# Patient Record
Sex: Male | Born: 1980 | Race: White | Hispanic: No | Marital: Single | Smoking: Current every day smoker
Health system: Southern US, Community
[De-identification: ages and names within clinical notes are randomized; demographics above are authoritative.]

## PROBLEM LIST (undated history)

## (undated) DIAGNOSIS — J45909 Unspecified asthma, uncomplicated: Secondary | ICD-10-CM

## (undated) DIAGNOSIS — M419 Scoliosis, unspecified: Secondary | ICD-10-CM

## (undated) HISTORY — PX: BACK SURGERY: SHX140

## (undated) HISTORY — PX: EYE SURGERY: SHX253

---

## 2020-03-24 ENCOUNTER — Emergency Department
Admission: EM | Admit: 2020-03-24 | Discharge: 2020-03-24 | Disposition: A | Discharge status: Home / Self Care | Payer: Self-pay | Attending: Emergency Medicine | Admitting: Emergency Medicine

## 2020-03-24 ENCOUNTER — Emergency Department: Payer: Self-pay

## 2020-03-24 ENCOUNTER — Other Ambulatory Visit: Payer: Self-pay

## 2020-03-24 ENCOUNTER — Encounter: Payer: Self-pay | Admitting: Emergency Medicine

## 2020-03-24 DIAGNOSIS — R519 Headache, unspecified: Secondary | ICD-10-CM | POA: Insufficient documentation

## 2020-03-24 DIAGNOSIS — R112 Nausea with vomiting, unspecified: Secondary | ICD-10-CM | POA: Insufficient documentation

## 2020-03-24 DIAGNOSIS — Z20822 Contact with and (suspected) exposure to covid-19: Secondary | ICD-10-CM | POA: Insufficient documentation

## 2020-03-24 DIAGNOSIS — J45909 Unspecified asthma, uncomplicated: Secondary | ICD-10-CM | POA: Insufficient documentation

## 2020-03-24 DIAGNOSIS — R0602 Shortness of breath: Secondary | ICD-10-CM

## 2020-03-24 DIAGNOSIS — M549 Dorsalgia, unspecified: Secondary | ICD-10-CM | POA: Insufficient documentation

## 2020-03-24 DIAGNOSIS — F17200 Nicotine dependence, unspecified, uncomplicated: Secondary | ICD-10-CM | POA: Insufficient documentation

## 2020-03-24 HISTORY — DX: Scoliosis, unspecified: M41.9

## 2020-03-24 HISTORY — DX: Unspecified asthma, uncomplicated: J45.909

## 2020-03-24 LAB — CBC WITH DIFFERENTIAL/PLATELET
Abs Immature Granulocytes: 0.02 10*3/uL (ref 0.00–0.07)
Basophils Absolute: 0.1 10*3/uL (ref 0.0–0.1)
Basophils Relative: 1 %
Eosinophils Absolute: 0.2 10*3/uL (ref 0.0–0.5)
Eosinophils Relative: 2 %
HCT: 44.2 % (ref 39.0–52.0)
Hemoglobin: 15.2 g/dL (ref 13.0–17.0)
Immature Granulocytes: 0 %
Lymphocytes Relative: 33 %
Lymphs Abs: 2.4 10*3/uL (ref 0.7–4.0)
MCH: 32.1 pg (ref 26.0–34.0)
MCHC: 34.4 g/dL (ref 30.0–36.0)
MCV: 93.4 fL (ref 80.0–100.0)
Monocytes Absolute: 0.5 10*3/uL (ref 0.1–1.0)
Monocytes Relative: 7 %
Neutro Abs: 4.1 10*3/uL (ref 1.7–7.7)
Neutrophils Relative %: 57 %
Platelets: 89 10*3/uL — ABNORMAL LOW (ref 150–400)
RBC: 4.73 MIL/uL (ref 4.22–5.81)
RDW: 12.4 % (ref 11.5–15.5)
WBC: 7.3 10*3/uL (ref 4.0–10.5)
nRBC: 0 % (ref 0.0–0.2)

## 2020-03-24 LAB — COMPREHENSIVE METABOLIC PANEL
ALT: 14 U/L (ref 0–44)
AST: 18 U/L (ref 15–41)
Albumin: 4.4 g/dL (ref 3.5–5.0)
Alkaline Phosphatase: 52 U/L (ref 38–126)
Anion gap: 8 (ref 5–15)
BUN: 13 mg/dL (ref 6–20)
CO2: 26 mmol/L (ref 22–32)
Calcium: 9 mg/dL (ref 8.9–10.3)
Chloride: 106 mmol/L (ref 98–111)
Creatinine, Ser: 0.93 mg/dL (ref 0.61–1.24)
GFR calc Af Amer: >60 mL/min (ref >60)
GFR calc non Af Amer: >60 mL/min (ref >60)
Glucose, Bld: 89 mg/dL (ref 70–99)
Potassium: 4.1 mmol/L (ref 3.5–5.1)
Sodium: 140 mmol/L (ref 135–145)
Total Bilirubin: 0.6 mg/dL (ref 0.3–1.2)
Total Protein: 7.2 g/dL (ref 6.5–8.1)

## 2020-03-24 LAB — RESPIRATORY PANEL BY RT PCR (FLU A&B, COVID)
Influenza A by PCR: NEGATIVE
Influenza B by PCR: NEGATIVE
SARS Coronavirus 2 by RT PCR: NEGATIVE

## 2020-03-24 MED ORDER — ONDANSETRON 4 MG PO TBDP: 4.0000 mg | ORAL_TABLET | Freq: Three times a day (TID) | ORAL | 0 refills | Status: DC | PRN

## 2020-03-24 MED ORDER — SODIUM CHLORIDE 0.9 % IV BOLUS
1000.0000 mL | Freq: Once | INTRAVENOUS | Status: AC
  Administered 2020-03-24: 1000 mL via INTRAVENOUS

## 2020-03-24 MED ORDER — DEXAMETHASONE SODIUM PHOSPHATE 10 MG/ML IJ SOLN
10.0000 mg | Freq: Once | INTRAMUSCULAR | Status: AC
  Administered 2020-03-24: 10 mg via INTRAVENOUS
  Filled 2020-03-24: qty 1

## 2020-03-24 MED ORDER — DIPHENHYDRAMINE HCL 50 MG/ML IJ SOLN
50.0000 mg | Freq: Once | INTRAMUSCULAR | Status: AC
  Administered 2020-03-24: 50 mg via INTRAVENOUS
  Filled 2020-03-24: qty 1

## 2020-03-24 MED ORDER — KETOROLAC TROMETHAMINE 30 MG/ML IJ SOLN
15.0000 mg | Freq: Once | INTRAMUSCULAR | Status: AC
  Administered 2020-03-24: 15 mg via INTRAVENOUS
  Filled 2020-03-24: qty 1

## 2020-03-24 MED ORDER — METOCLOPRAMIDE HCL 5 MG/ML IJ SOLN
10.0000 mg | Freq: Once | INTRAMUSCULAR | Status: AC
  Administered 2020-03-24: 10 mg via INTRAVENOUS
  Filled 2020-03-24: qty 2

## 2020-03-24 NOTE — ED Notes (Signed)
Pt to the er for headache x 5 days. No hx of migraines. Pt not taking meds at home because "pills make me sick." Pt states he was given OTC med before and they made him sick. Asked pt if he was able to take anything by mouth or took any daily meds. Pt denies.

## 2020-03-24 NOTE — ED Provider Notes (Signed)
[facility] Emergency Department Provider Note  ___________________________________________   First MD Initiated Contact with Patient 03/24/20 1103     (approximate)  I have reviewed the triage vital signs and the nursing notes.   HISTORY  Chief Complaint Migraine, Emesis, and Back Pain  HPI Sean Webster is a 39 y.o. male who presents to the emergency department with complaints of headache x5 days, vomiting x2.  Patient has had 4 total episodes of vomiting in the last 2 days.  No hematemesis or coffee ground vomit.  The patient also has chronic thoracic back pain secondary to surgery for scoliosis 22 years ago, however this pain has acutely been worse in the last 5 days.  Lastly, he complains of chest pain only when taking a deep breath that has been present for about 3 months with no acute worsening.  He has not been around any contacts that he is aware of but has not been vaccinated.  He denies weakness, fever, nasal congestion, sore throat, abdominal pain, constipation or diarrhea.    Past Medical History:  Diagnosis Date  . Asthma   . Scoliosis     There are no problems to display for this patient.   Past Surgical History:  Procedure Laterality Date  . BACK SURGERY    . EYE SURGERY      Prior to Admission medications   Medication Sig Start Date End Date Taking? Authorizing Provider  ondansetron (ZOFRAN ODT) 4 MG disintegrating tablet Take 1 tablet (4 mg total) by mouth every 8 (eight) hours as needed for nausea or vomiting. 03/24/20   Lucy Chris, PA    Allergies Patient has no known allergies.  No family history on file.  Social History Social History   Tobacco Use  . Smoking status: Current Every Day Smoker  . Smokeless tobacco: Never Used  Substance Use Topics  . Alcohol use: Not Currently  . Drug use: Not Currently    Types: Cocaine, Marijuana    Comment: last used in July    Review of Systems Constitutional: No  fever/chills Eyes: No visual changes. ENT: No sore throat. Cardiovascular: Chest pain with deep breath only Respiratory: Denies shortness of breath. Gastrointestinal: No abdominal pain.  No nausea, no vomiting.  No diarrhea.  No constipation. Genitourinary: Negative for dysuria. Musculoskeletal: +back pain. Skin: Negative for rash. Neurological: + Headache, no focal weakness or numbness.  ____________________________________________   PHYSICAL EXAM:  VITAL SIGNS: ED Triage Vitals  Enc Vitals Group     BP 03/24/20 1534 111/67     Pulse Rate 03/24/20 1001 (!) 58     Resp 03/24/20 1001 16     Temp 03/24/20 1001 97.8 F (36.6 C)     Temp Source 03/24/20 1001 Oral     SpO2 03/24/20 1001 97 %     Weight 03/24/20 0958 140 lb (63.5 kg)     Height 03/24/20 0958 5\' 8"  (1.727 m)     Head Circumference --      Peak Flow --      Pain Score 03/24/20 0958 6     Pain Loc --      Pain Edu? --      Excl. in GC? --    Constitutional: Alert and oriented. Well appearing and in no acute distress. Eyes: Conjunctivae are normal. PERRL. EOMI. Head: Atraumatic. Nose: No congestion/rhinnorhea. Mouth/Throat: Mucous membranes are moist.  Oropharynx non-erythematous. Neck: No stridor.   Cardiovascular: Normal rate, regular rhythm. Grossly normal heart  sounds.  Good peripheral circulation. Respiratory: Normal respiratory effort.  No retractions. Lungs CTAB. Gastrointestinal: Soft and nontender. No distention.  No CVA tenderness. Musculoskeletal: No lower extremity tenderness nor edema.  No joint effusions. Neurologic:  Normal speech and language. No gross focal neurologic deficits are appreciated. No gait instability. Skin:  Skin is warm, dry and intact. No rash noted. Psychiatric: Mood and affect are normal. Speech and behavior are normal.  ____________________________________________   LABS (all labs ordered are listed, but only abnormal results are displayed)  Labs Reviewed  CBC WITH  DIFFERENTIAL/PLATELET - Abnormal; Notable for the following components:      Result Value   Platelets 89 (*)    All other components within normal limits  RESPIRATORY PANEL BY RT PCR (FLU A&B, COVID)  COMPREHENSIVE METABOLIC PANEL   ____________________________________________  RADIOLOGY  Official radiology report(s): DG Chest 2 View  Result Date: 03/24/2020 CLINICAL DATA:  History of tobacco use and back pain, initial encounter EXAM: CHEST - 2 VIEW COMPARISON:  None. FINDINGS: Cardiac shadow is within normal limits. The lungs are clear bilaterally. Bilateral nipple shadows noted. No acute bony abnormality is seen. Changes of prior fusion at the thoracolumbar junction are noted. IMPRESSION: No acute abnormality noted. Electronically Signed   By: Alcide Clever M.D.   On: 03/24/2020 12:25   DG Thoracic Spine 2 View  Result Date: 03/24/2020 CLINICAL DATA:  Shortness of breath and history of tobacco use with back pain, initial encounter EXAM: THORACIC SPINE 2 VIEWS COMPARISON:  None. FINDINGS: Vertebral body height is well maintained. Changes of prior fusion from T11 to L3 is seen with lateral fixation on the left. No definitive hardware failure is seen. No compression deformities are noted. Increased kyphosis is noted likely of a chronic nature at T10-T11. No other focal abnormality is noted. IMPRESSION: Postsurgical changes as described above. Electronically Signed   By: Alcide Clever M.D.   On: 03/24/2020 12:25   CT Head Wo Contrast  Result Date: 03/24/2020 CLINICAL DATA:  Headache. EXAM: CT HEAD WITHOUT CONTRAST TECHNIQUE: Contiguous axial images were obtained from the base of the skull through the vertex without intravenous contrast. COMPARISON:  None. FINDINGS: Brain: No evidence of acute infarction, hemorrhage, hydrocephalus, extra-axial collection or mass lesion/mass effect. Patchy white matter hypoattenuation, which is advanced for patient age. Vascular: No hyperdense vessel identified.  Skull: Normal. Negative for fracture or focal lesion. Sinuses/Orbits: Mild mucosal thickening the left maxillary sinus with frothy secretions. Other: No mastoid effusions IMPRESSION: 1. No evidence of acute intracranial abnormality. 2. Patchy white matter hypoattenuation, which is age-advanced. While suboptimally evaluated and nonspecific by CT, this most likely represents chronic microvascular ischemic disease. An MRI with contrast could further evaluate if clinically indicated. Electronically Signed   By: Feliberto Harts MD   On: 03/24/2020 14:52    ____________________________________________   INITIAL IMPRESSION / ASSESSMENT AND PLAN / ED COURSE  As part of my medical decision making, I reviewed the following data within the electronic MEDICAL RECORD NUMBER Nursing notes reviewed and incorporated        Kinley Ferrentino is a 39 year old male who presents to the emergency department with headache, vomiting, acute on chronic thoracic back pain as well as chest pain with deep inspiration.  Physical exam is grossly unremarkable.  Laboratory evaluation includes respiratory panel which is negative, normal CMP and a grossly normal CBC except for thrombocytopenia.  X-ray of the chest and thoracic spine are negative for any acute findings but do show the chronic  postsurgical changes related to his previous surgery.  At that time, treatment for headache and vomiting was begun with Toradol, Reglan, Benadryl, Decadron and a bolus of fluids.  The patient states that he was only mildly improved initially with these medications.  Thus a CT was performed to rule out more sinister pathology.  This was negative for any acute changes but did show some chronic age advanced ischemic changes.  After this, the patient was reinterviewed and stated at this time his headache was making more improvement.  With a normal neuro exam and no acute findings in work-up, feel that he is stable for discharge.  I did recommend that he  follow-up with the clinic regarding the thrombocytopenia as well as chronic changes found on his CT.  [facility].  Patient is amenable with this plan.      ____________________________________________   FINAL CLINICAL IMPRESSION(S) / ED DIAGNOSES  Final diagnoses:  Acute nonintractable headache, unspecified headache type  Nausea and vomiting, intractability of vomiting not specified, unspecified vomiting type     ED Discharge Orders         Ordered    ondansetron (ZOFRAN ODT) 4 MG disintegrating tablet  Every 8 hours PRN,   Status:  Discontinued        03/24/20 1502    ondansetron (ZOFRAN ODT) 4 MG disintegrating tablet  Every 8 hours PRN        03/24/20 1517          *Please note:  Justus Droke was evaluated in Emergency Department on 03/24/2020 for the symptoms described in the history of present illness. He was evaluated in the context of the global COVID-19 pandemic, which necessitated consideration that the patient might be at risk for infection with the SARS-CoV-2 virus that causes COVID-19. Institutional protocols and algorithms that pertain to the evaluation of patients at risk for COVID-19 are in a state of rapid change based on information released by regulatory bodies including the CDC and federal and state organizations. These policies and algorithms were followed during the patient's care in the ED.  Some ED evaluations and interventions may be delayed as a result of limited staffing during and the pandemic.*   Note:  This document was prepared using Dragon voice recognition software and may include unintentional dictation errors.    Lucy Chris, PA 03/24/20 1758    Merwyn Katos, MD 03/25/20 [phone]

## 2020-03-24 NOTE — Discharge Instructions (Signed)
Please [facility] with [facility] regarding the low platelets in your blood work as well as changes seen on your CT scan. You may use ibuprofen or Tylenol for treatment of your headache. We will also prescribe Zofran for your nausea and vomiting. Please see the [facility], urgent care or return to the emergency department for any worsening symptoms.

## 2020-03-24 NOTE — ED Notes (Signed)
Pt to ct 

## 2020-03-24 NOTE — ED Triage Notes (Signed)
Pt to ED via POV c/o migraine x 5 days, pt states that it will ease off but then get stronger again. Pt states that he started vomiting last night around 2130. Pt states that he has had 4 episodes of vomiting since last night. Pt also states that he has chronic back pain and it has been worse over the past week, pt has not had any injury recently. Pt states that he did have back surgery in 1999 due to scoliosis. Pt is in NAD at this time.

## 2020-12-25 ENCOUNTER — Emergency Department: Payer: Self-pay

## 2020-12-25 ENCOUNTER — Other Ambulatory Visit: Payer: Self-pay

## 2020-12-25 ENCOUNTER — Emergency Department
Admission: EM | Admit: 2020-12-25 | Discharge: 2020-12-25 | Disposition: A | Discharge status: Home / Self Care | Payer: Self-pay | Attending: Emergency Medicine | Admitting: Emergency Medicine

## 2020-12-25 ENCOUNTER — Encounter: Payer: Self-pay | Admitting: Medical Oncology

## 2020-12-25 DIAGNOSIS — J181 Lobar pneumonia, unspecified organism: Secondary | ICD-10-CM | POA: Insufficient documentation

## 2020-12-25 DIAGNOSIS — J189 Pneumonia, unspecified organism: Secondary | ICD-10-CM

## 2020-12-25 DIAGNOSIS — Z20822 Contact with and (suspected) exposure to covid-19: Secondary | ICD-10-CM | POA: Insufficient documentation

## 2020-12-25 DIAGNOSIS — F17200 Nicotine dependence, unspecified, uncomplicated: Secondary | ICD-10-CM | POA: Insufficient documentation

## 2020-12-25 DIAGNOSIS — J45909 Unspecified asthma, uncomplicated: Secondary | ICD-10-CM | POA: Insufficient documentation

## 2020-12-25 DIAGNOSIS — Z28310 Unvaccinated for covid-19: Secondary | ICD-10-CM | POA: Insufficient documentation

## 2020-12-25 LAB — CBC WITH DIFFERENTIAL/PLATELET
Abs Immature Granulocytes: 0.06 10*3/uL (ref 0.00–0.07)
Basophils Absolute: 0.1 10*3/uL (ref 0.0–0.1)
Basophils Relative: 1 %
Eosinophils Absolute: 0.3 10*3/uL (ref 0.0–0.5)
Eosinophils Relative: 2 %
HCT: 41.8 % (ref 39.0–52.0)
Hemoglobin: 14.5 g/dL (ref 13.0–17.0)
Immature Granulocytes: 1 %
Lymphocytes Relative: 16 %
Lymphs Abs: 1.9 10*3/uL (ref 0.7–4.0)
MCH: 31.3 pg (ref 26.0–34.0)
MCHC: 34.7 g/dL (ref 30.0–36.0)
MCV: 90.3 fL (ref 80.0–100.0)
Monocytes Absolute: 0.7 10*3/uL (ref 0.1–1.0)
Monocytes Relative: 6 %
Neutro Abs: 8.8 10*3/uL — ABNORMAL HIGH (ref 1.7–7.7)
Neutrophils Relative %: 74 %
Platelets: 88 10*3/uL — ABNORMAL LOW (ref 150–400)
RBC: 4.63 MIL/uL (ref 4.22–5.81)
RDW: 12.5 % (ref 11.5–15.5)
Smear Review: NORMAL
WBC: 11.9 10*3/uL — ABNORMAL HIGH (ref 4.0–10.5)
nRBC: 0 % (ref 0.0–0.2)

## 2020-12-25 LAB — BASIC METABOLIC PANEL
Anion gap: 9 (ref 5–15)
BUN: 12 mg/dL (ref 6–20)
CO2: 28 mmol/L (ref 22–32)
Calcium: 8.5 mg/dL — ABNORMAL LOW (ref 8.9–10.3)
Chloride: 104 mmol/L (ref 98–111)
Creatinine, Ser: 0.99 mg/dL (ref 0.61–1.24)
GFR, Estimated: >60 mL/min (ref >60)
Glucose, Bld: 91 mg/dL (ref 70–99)
Potassium: 3.3 mmol/L — ABNORMAL LOW (ref 3.5–5.1)
Sodium: 141 mmol/L (ref 135–145)

## 2020-12-25 LAB — SARS CORONAVIRUS 2 (TAT 6-24 HRS): SARS Coronavirus 2: NEGATIVE

## 2020-12-25 MED ORDER — PSEUDOEPH-BROMPHEN-DM 30-2-10 MG/5ML PO SYRP: 5.0000 mL | ORAL_SOLUTION | Freq: Four times a day (QID) | ORAL | 0 refills | Status: AC | PRN

## 2020-12-25 MED ORDER — KETOROLAC TROMETHAMINE 60 MG/2ML IM SOLN
60.0000 mg | Freq: Once | INTRAMUSCULAR | Status: AC
  Administered 2020-12-25: 08:00:00 60 mg via INTRAMUSCULAR
  Filled 2020-12-25: qty 2

## 2020-12-25 MED ORDER — IBUPROFEN 800 MG PO TABS: 800.0000 mg | ORAL_TABLET | Freq: Three times a day (TID) | ORAL | 0 refills | Status: AC | PRN

## 2020-12-25 MED ORDER — AZITHROMYCIN 250 MG PO TABS: ORAL_TABLET | ORAL | 0 refills | Status: AC

## 2020-12-25 NOTE — Discharge Instructions (Addendum)
Read and follow discharge care instructions.  Take medication as directed.  Your COVID-19 test results are pending.  They will be available later MyChart app.  Advised self quarantine pending results of COVID-19 test.  Test is positive was quarantine additional 10 days.

## 2020-12-25 NOTE — ED Provider Notes (Signed)
[facility] Emergency Department Provider Note   ____________________________________________   Event Date/Time   First MD Initiated Contact with Patient 12/25/20 [phone]     (approximate)  I have reviewed the triage vital signs and the nursing notes.   HISTORY  Chief Complaint Generalized Body Aches and Cough    HPI Sean Webster is a 40 y.o. male patient presents with 2 weeks of generalized body aches, cough, and chest congestion.  Denies recent travel but believes positive contact with COVID-19.  No COVID-vaccine.         Past Medical History:  Diagnosis Date   Asthma    Scoliosis     There are no problems to display for this patient.   Past Surgical History:  Procedure Laterality Date   BACK SURGERY     EYE SURGERY      Prior to Admission medications   Medication Sig Start Date End Date Taking? Authorizing Provider  azithromycin (ZITHROMAX Z-PAK) 250 MG tablet Take 2 tablets (500 mg) on  Day 1,  followed by 1 tablet (250 mg) once daily on Days 2 through 5. 12/25/20 12/30/20 Yes Joni Reining, PA-C  brompheniramine-pseudoephedrine-DM 30-2-10 MG/5ML syrup Take 5 mLs by mouth 4 (four) times daily as needed. 12/25/20  Yes Joni Reining, PA-C  ibuprofen (ADVIL) 800 MG tablet Take 1 tablet (800 mg total) by mouth every 8 (eight) hours as needed for moderate pain. 12/25/20  Yes Joni Reining, PA-C    Allergies Patient has no known allergies.  No family history on file.  Social History Social History   Tobacco Use   Smoking status: Every Day    Pack years: 0.00   Smokeless tobacco: Never  Substance Use Topics   Alcohol use: Not Currently   Drug use: Not Currently    Types: Cocaine, Marijuana    Comment: last used in July    Review of Systems  Constitutional: Subjective fever.   eyes: No visual changes. ENT: No sore throat. Cardiovascular: Denies chest pain. Respiratory: Denies shortness of breath. Gastrointestinal: No  abdominal pain.  No nausea, no vomiting.  No diarrhea.  No constipation. Genitourinary: Negative for dysuria. Musculoskeletal: Negative for back pain. Skin: Negative for rash. Neurological: Positive for headaches, but denies focal weakness or numbness. ____________________________________________   PHYSICAL EXAM:  VITAL SIGNS: ED Triage Vitals  Enc Vitals Group     BP 12/25/20 0751 113/79     Pulse Rate 12/25/20 0751 70     Resp 12/25/20 0751 16     Temp 12/25/20 0751 98.3 F (36.8 C)     Temp Source 12/25/20 0751 Oral     SpO2 12/25/20 0751 99 %     Weight 12/25/20 0749 138 lb 14.2 oz (63 kg)     Height 12/25/20 0749 5\' 8"  (1.727 m)     Head Circumference --      Peak Flow --      Pain Score 12/25/20 0749 8     Pain Loc --      Pain Edu? --      Excl. in GC? --     Constitutional: Alert and oriented. Well appearing and in no acute distress.  Appears malaise. Eyes: Conjunctivae are normal. PERRL. EOMI. Head: Atraumatic. Nose: No congestion/rhinnorhea. Mouth/Throat: Mucous membranes are moist.  Oropharynx non-erythematous. Neck: No stridor.  No cervical spine tenderness to palpation. Hematological/Lymphatic/Immunilogical: No cervical lymphadenopathy. Cardiovascular: Normal rate, regular rhythm. Grossly normal heart sounds.  Good peripheral circulation. Respiratory:  Normal respiratory effort.  No retractions. Lungs CTAB. Gastrointestinal: Soft and nontender. No distention. No abdominal bruits. No CVA tenderness. Genitourinary: Deferred Musculoskeletal: No lower extremity tenderness nor edema.  No joint effusions. Neurologic:  Normal speech and language. No gross focal neurologic deficits are appreciated. No gait instability. Skin:  Skin is warm, dry and intact. No rash noted. Psychiatric: Mood and affect are normal. Speech and behavior are normal.  ____________________________________________   LABS (all labs ordered are listed, but only abnormal results are  displayed)  Labs Reviewed  BASIC METABOLIC PANEL - Abnormal; Notable for the following components:      Result Value   Potassium 3.3 (*)    Calcium 8.5 (*)    All other components within normal limits  CBC WITH DIFFERENTIAL/PLATELET - Abnormal; Notable for the following components:   WBC 11.9 (*)    Platelets 88 (*)    Neutro Abs 8.8 (*)    All other components within normal limits  SARS CORONAVIRUS 2 (TAT 6-24 HRS)   ____________________________________________  EKG   ____________________________________________  RADIOLOGY Margarite Gouge, personally viewed and evaluated these images (plain radiographs) as part of my medical decision making, as well as reviewing the written report by the radiologist.  ED MD interpretation:    Official radiology report(s): DG Chest Portable 1 View  Result Date: 12/25/2020 CLINICAL DATA:  Cough and congestion EXAM: PORTABLE CHEST 1 VIEW COMPARISON:  September 2021 FINDINGS: Mildly increased interstitial prominence. No pleural effusion or pneumothorax. Normal heart size. Partially imaged thoracolumbar fusion. IMPRESSION: Mildly increased interstitial prominence, which could reflect atypical/viral pneumonia. Electronically Signed   By: Guadlupe Spanish M.D.   On: 12/25/2020 08:35    ____________________________________________   PROCEDURES  Procedure(s) performed (including Critical Care):  Procedures   ____________________________________________   INITIAL IMPRESSION / ASSESSMENT AND PLAN / ED COURSE  As part of my medical decision making, I reviewed the following data within the electronic MEDICAL RECORD NUMBER         Patient presents 2 weeks of body aches, cough, intermittent fever.  Discussed chest x-ray finding with patient.  Patient advised COVID-19 test was pending.  Advised to follow discharge care instruction and self quarantine pending results of test.  If test is positive must quarantine additional 10 days.  Take medication as  directed.      ____________________________________________   FINAL CLINICAL IMPRESSION(S) / ED DIAGNOSES  Final diagnoses:  Community acquired pneumonia of left lower lobe of lung     ED Discharge Orders          Ordered    azithromycin (ZITHROMAX Z-PAK) 250 MG tablet        12/25/20 1144    ibuprofen (ADVIL) 800 MG tablet  Every 8 hours PRN        12/25/20 1144    brompheniramine-pseudoephedrine-DM 30-2-10 MG/5ML syrup  4 times daily PRN        12/25/20 1144             Note:  This document was prepared using Dragon voice recognition software and may include unintentional dictation errors.    Joni Reining, PA-C 12/25/20 1527    Merwyn Katos, MD 12/25/20 [phone]

## 2020-12-25 NOTE — ED Notes (Signed)
See triage note  Presents with body aches ,h/a and weakness for 2 weeks  Subjective fever at home but afebrile on arrival  Denies taking any OTC meds for sx's

## 2020-12-25 NOTE — ED Triage Notes (Signed)
Flu like sx's for approx 2 weeks. Pt reports body aches are worsening.

## 2021-11-25 ENCOUNTER — Emergency Department: Payer: Self-pay

## 2021-11-25 ENCOUNTER — Other Ambulatory Visit: Payer: Self-pay

## 2021-11-25 ENCOUNTER — Emergency Department
Admission: EM | Admit: 2021-11-25 | Discharge: 2021-11-25 | Disposition: A | Discharge status: Home / Self Care | Payer: Self-pay | Attending: Emergency Medicine | Admitting: Emergency Medicine

## 2021-11-25 ENCOUNTER — Encounter: Payer: Self-pay | Admitting: Emergency Medicine

## 2021-11-25 DIAGNOSIS — G43009 Migraine without aura, not intractable, without status migrainosus: Secondary | ICD-10-CM

## 2021-11-25 DIAGNOSIS — J45909 Unspecified asthma, uncomplicated: Secondary | ICD-10-CM | POA: Insufficient documentation

## 2021-11-25 DIAGNOSIS — R0602 Shortness of breath: Secondary | ICD-10-CM | POA: Insufficient documentation

## 2021-11-25 DIAGNOSIS — R079 Chest pain, unspecified: Secondary | ICD-10-CM | POA: Insufficient documentation

## 2021-11-25 DIAGNOSIS — G43019 Migraine without aura, intractable, without status migrainosus: Secondary | ICD-10-CM | POA: Insufficient documentation

## 2021-11-25 LAB — CBC
HCT: 46.6 % (ref 39.0–52.0)
Hemoglobin: 15 g/dL (ref 13.0–17.0)
MCH: 30.2 pg (ref 26.0–34.0)
MCHC: 32.2 g/dL (ref 30.0–36.0)
MCV: 94 fL (ref 80.0–100.0)
Platelets: 61 10*3/uL — ABNORMAL LOW (ref 150–400)
RBC: 4.96 MIL/uL (ref 4.22–5.81)
RDW: 13.1 % (ref 11.5–15.5)
WBC: 8.3 10*3/uL (ref 4.0–10.5)
nRBC: 0 % (ref 0.0–0.2)

## 2021-11-25 LAB — BASIC METABOLIC PANEL
Anion gap: 11 (ref 5–15)
BUN: 16 mg/dL (ref 6–20)
CO2: 22 mmol/L (ref 22–32)
Calcium: 9 mg/dL (ref 8.9–10.3)
Chloride: 106 mmol/L (ref 98–111)
Creatinine, Ser: 1 mg/dL (ref 0.61–1.24)
GFR, Estimated: >60 mL/min (ref >60)
Glucose, Bld: 117 mg/dL — ABNORMAL HIGH (ref 70–99)
Potassium: 3.8 mmol/L (ref 3.5–5.1)
Sodium: 139 mmol/L (ref 135–145)

## 2021-11-25 LAB — TROPONIN I (HIGH SENSITIVITY): Troponin I (High Sensitivity): <2 ng/L (ref <18)

## 2021-11-25 MED ORDER — BUTALBITAL-APAP-CAFFEINE 50-325-40 MG PO TABS: 1.0000 | ORAL_TABLET | Freq: Four times a day (QID) | ORAL | 0 refills | Status: AC | PRN

## 2021-11-25 MED ORDER — KETOROLAC TROMETHAMINE 30 MG/ML IJ SOLN
30.0000 mg | Freq: Once | INTRAMUSCULAR | Status: AC
  Administered 2021-11-25: 30 mg via INTRAMUSCULAR
  Filled 2021-11-25: qty 1

## 2021-11-25 NOTE — ED Notes (Signed)
dc ppw provided to patient, questions answered, followup and RX information provided. Pt declines VS at time of dc and provides verbal consent for dc. PT ambulatory to lobby alert and oriented at this time. 

## 2021-11-25 NOTE — ED Triage Notes (Signed)
Pt reports mid chest pain non radiating and sharp in nature since Friday. Pt reports sob as well as cough and congestion. Pt reports has had a migraine since Friday as well but has not taken anything for it. Pt states hx of migraines and reports its the same

## 2021-11-25 NOTE — ED Provider Notes (Signed)
[facility] Provider Note    Event Date/Time   First MD Initiated Contact with Patient 11/25/21 1509     (approximate)   History   Chest Pain, Migraine, and Shortness of Breath   HPI  Sean Webster is a 41 y.o. male with a history of asthma who presents with multiple complaints.  Patient complains of intermittent burning in his chest, particular when he lays down at night, also reports he has been having a migraine, felt short of breath earlier today, feeling better now.  Denies pleurisy.  No neurodeficits.  Reports has been having migraines his whole life, has not take anything for this.  No fevers or chills or cough.  No history of heart disease, denies drug use     Physical Exam   Triage Vital Signs: ED Triage Vitals  Enc Vitals Group     BP 11/25/21 1251 108/68     Pulse Rate 11/25/21 1251 (!) 59     Resp 11/25/21 1251 16     Temp 11/25/21 1251 98.9 F (37.2 C)     Temp Source 11/25/21 1251 Oral     SpO2 11/25/21 1251 96 %     Weight 11/25/21 1248 72.6 kg (160 lb)     Height 11/25/21 1248 1.727 m (5\' 8" )     Head Circumference --      Peak Flow --      Pain Score 11/25/21 1247 3     Pain Loc --      Pain Edu? --      Excl. in GC? --     Most recent vital signs: Vitals:   11/25/21 1251  BP: 108/68  Pulse: (!) 59  Resp: 16  Temp: 98.9 F (37.2 C)  SpO2: 96%     General: Awake, no distress.  CV:  Good peripheral perfusion.  Regular rate and rhythm Resp:  Normal effort.  CTA bilaterally Abd:  No distention.  Other:  No calf pain or swelling   ED Results / Procedures / Treatments   Labs (all labs ordered are listed, but only abnormal results are displayed) Labs Reviewed  BASIC METABOLIC PANEL - Abnormal; Notable for the following components:      Result Value   Glucose, Bld 117 (*)    All other components within normal limits  CBC - Abnormal; Notable for the following components:   Platelets 61 (*)    All other  components within normal limits  TROPONIN I (HIGH SENSITIVITY)     EKG  ED ECG REPORT I, 11/27/21, the attending physician, personally viewed and interpreted this ECG.  Date: 11/25/2021  Rhythm: normal sinus rhythm QRS Axis: normal Intervals: normal ST/T Wave abnormalities: normal Narrative Interpretation: no evidence of acute ischemia    RADIOLOGY Chest x-ray viewed interpret by me, no acute abnormality    PROCEDURES:  Critical Care performed:   Procedures   MEDICATIONS ORDERED IN ED: Medications  ketorolac (TORADOL) 30 MG/ML injection 30 mg (30 mg Intramuscular Given 11/25/21 1525)     IMPRESSION / MDM / ASSESSMENT AND PLAN / ED COURSE  I reviewed the triage vital signs and the nursing notes. Patient's presentation is most consistent with acute presentation with potential threat to life or bodily function.  Patient presents with multiple complaints as detailed above.  Doubt ACS given description of burning when lying down, more likely consistent with GERD, EKG and high-sensitivity troponin are normal  Chest x-ray without evidence of pneumothorax or pneumonia,  patient has no increased work of breathing here in the emergency department and reports this is improved.  Patient is PERC negative, low risk for PE and his HPI is not consistent with this.  Patient treated with IM Toradol for migraine, Fioricet prescription provided, outpatient follow-up with cardiology as needed        FINAL CLINICAL IMPRESSION(S) / ED DIAGNOSES   Final diagnoses:  Chest pain, unspecified type  Migraine without aura and without status migrainosus, not intractable     Rx / DC Orders   ED Discharge Orders          Ordered    Ambulatory referral to Cardiology       Comments: If you have not heard from the Cardiology office within the next 72 hours please call [phone].   11/25/21 1518    butalbital-acetaminophen-caffeine (FIORICET) 50-325-40 MG tablet  Every 6 hours  PRN        11/25/21 1519             Note:  This document was prepared using Dragon voice recognition software and may include unintentional dictation errors.   Jene Every, MD 11/25/21 [phone]

## 2022-09-14 IMAGING — DX DG CHEST 1V PORT
1 series · 1 of 1 positions shown · non-contrast
Comparison: February 2020

CLINICAL DATA: Cough and congestion

EXAM:
PORTABLE CHEST 1 VIEW

[chest ap]
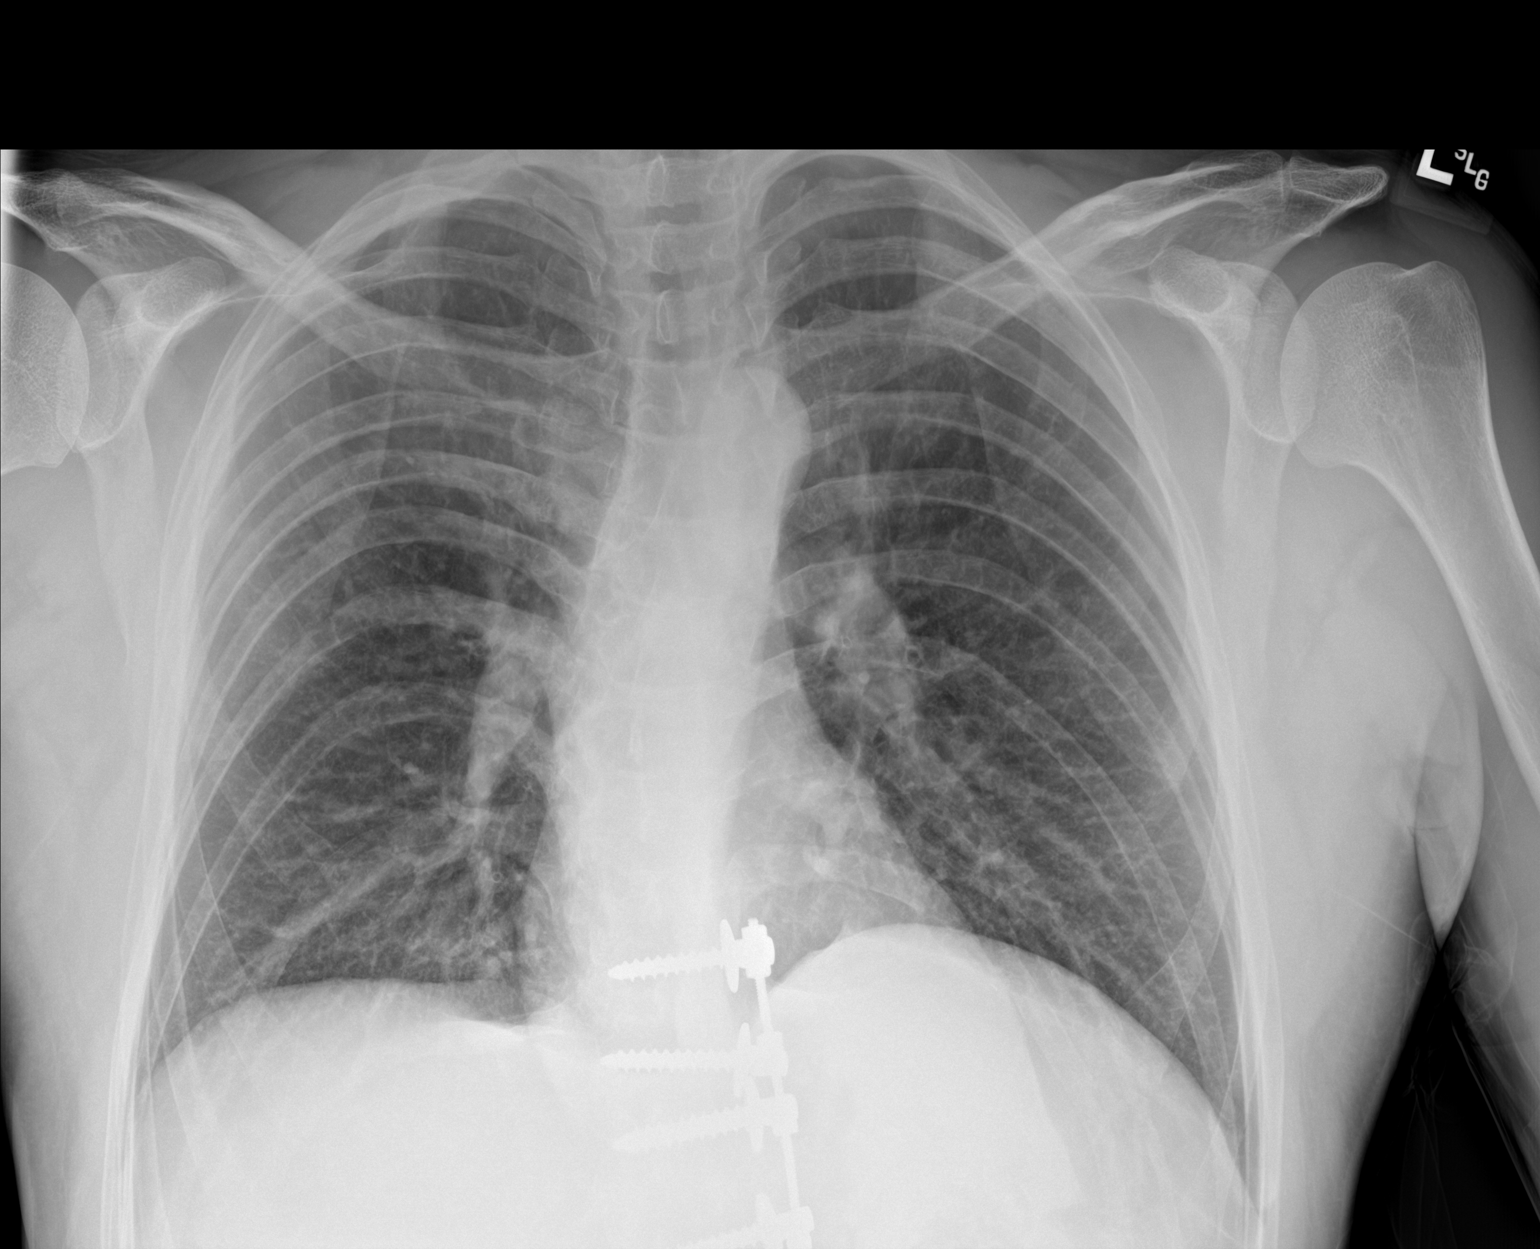

[1 of 1 positions shown; findings below may reference images not displayed]

FINDINGS: Mildly increased interstitial prominence. No pleural effusion or
pneumothorax. Normal heart size. Partially imaged thoracolumbar
fusion.
IMPRESSION: Mildly increased interstitial prominence, which could reflect
atypical/viral pneumonia.

## 2023-08-15 IMAGING — CR DG CHEST 2V
2 series · 2 of 2 positions shown · non-contrast
Comparison: AP chest 12/25/2020

CLINICAL DATA: Chest pain.

EXAM:
CHEST - 2 VIEW

[chest pa]
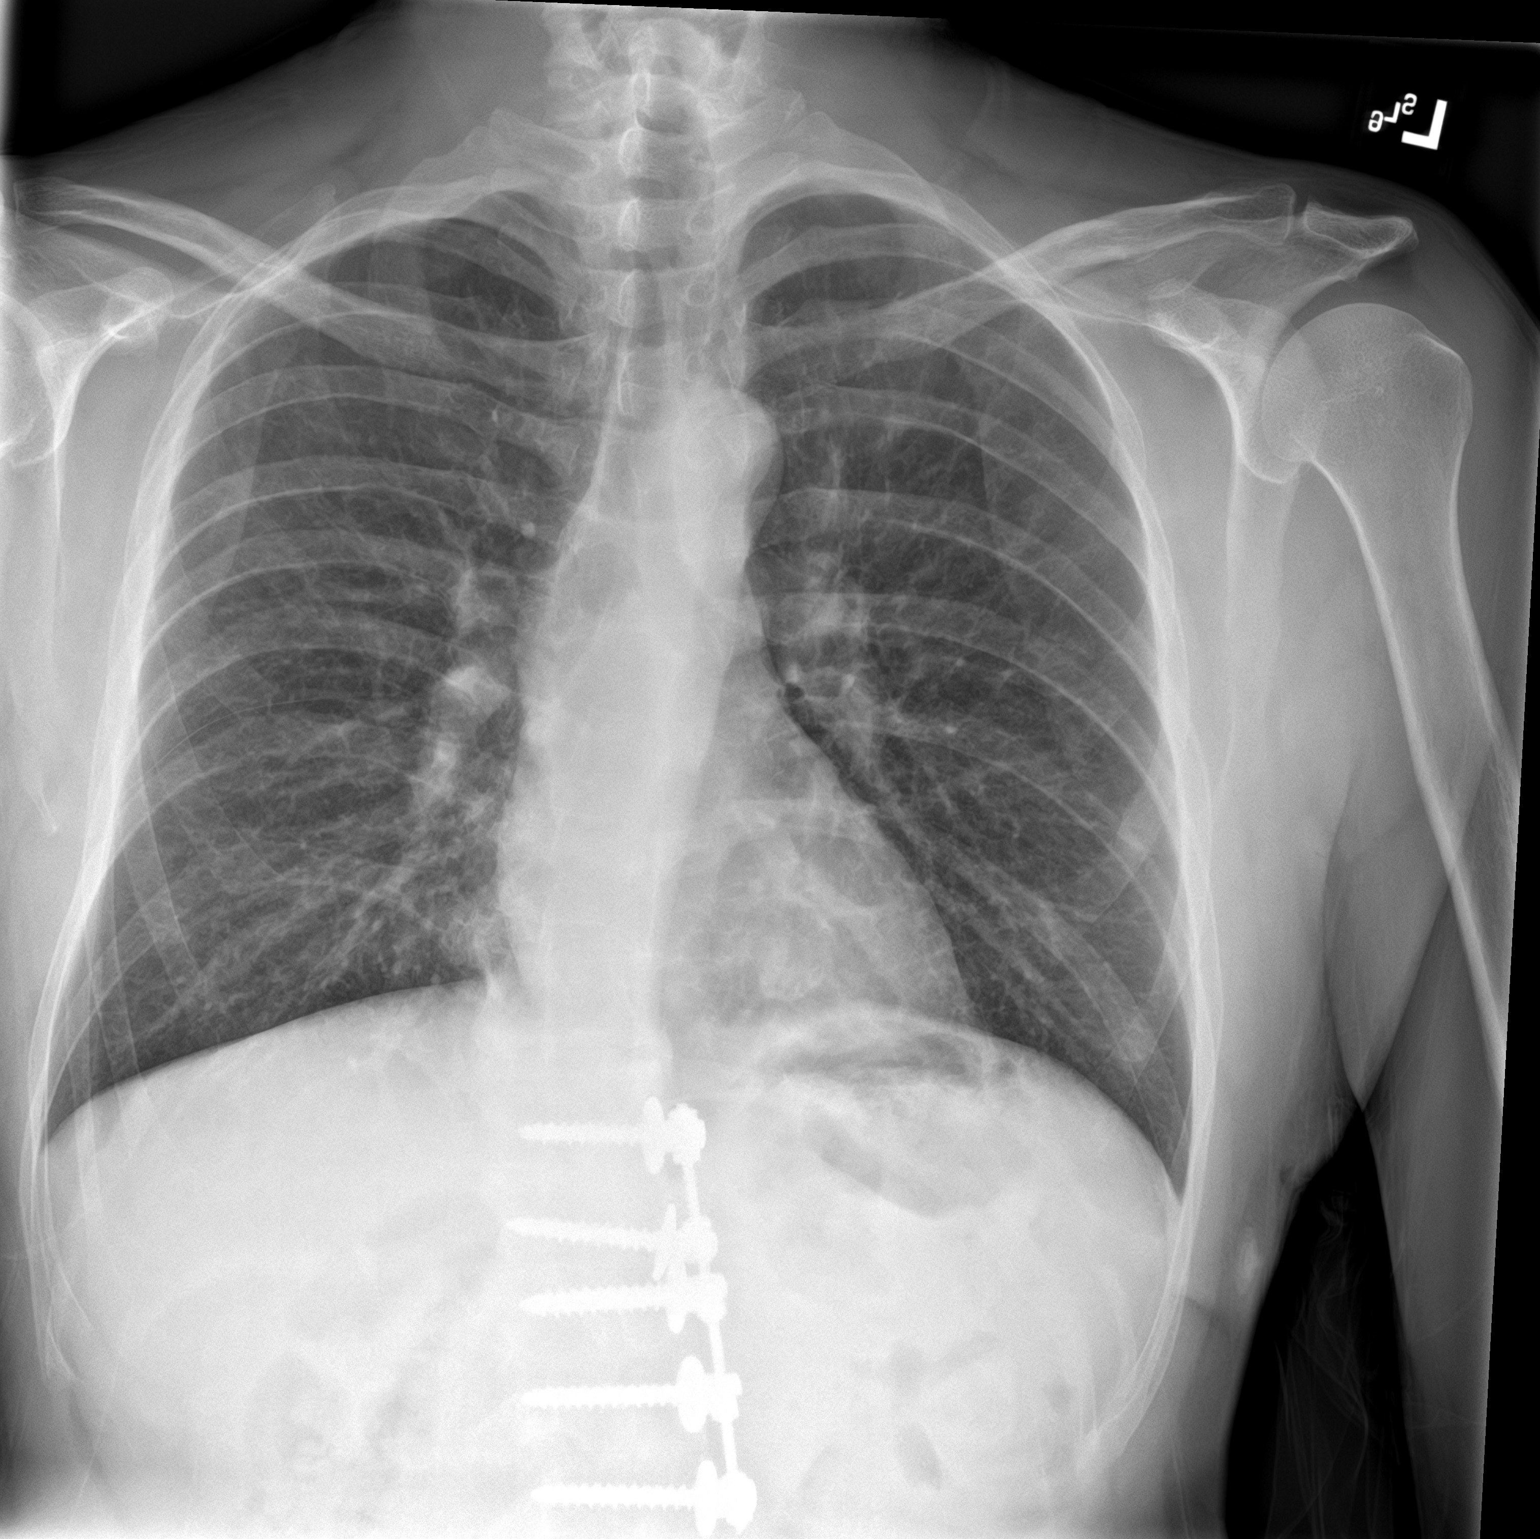

[chest lat]
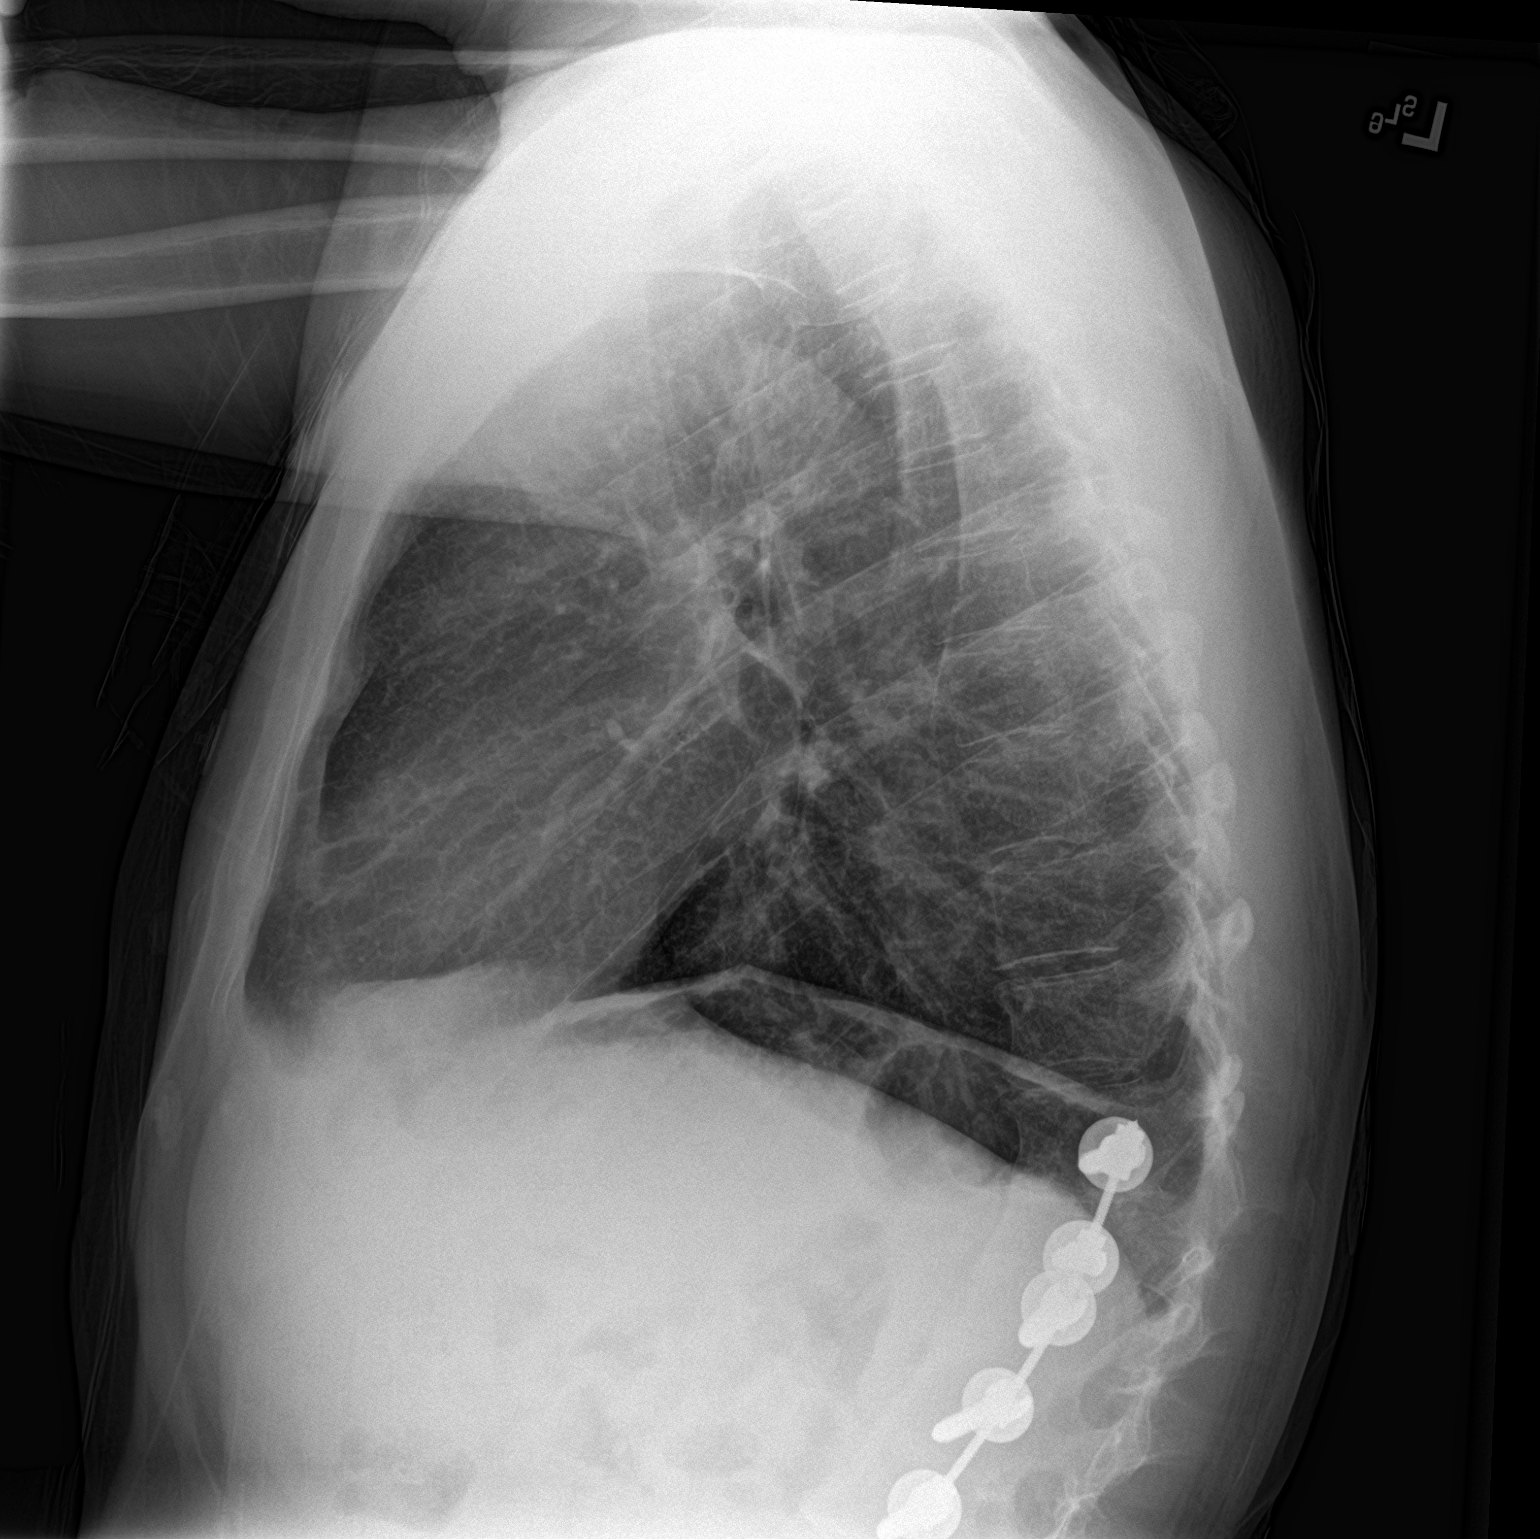

[2 of 2 positions shown; findings below may reference images not displayed]

FINDINGS: Cardiac silhouette and mediastinal contours are within normal
limits. The lungs are clear. No pleural effusion or pneumothorax.
Partial visualization of T11 through the visualized at least L3 left
screw and bar fixation.
IMPRESSION: No active cardiopulmonary disease.
